# Patient Record
Sex: Female | Born: 1960 | Race: Black or African American | Hispanic: No | Marital: Married | Smoking: Never smoker
Health system: Southern US, Community
[De-identification: ages and names within clinical notes are randomized; demographics above are authoritative.]

## PROBLEM LIST (undated history)

## (undated) DIAGNOSIS — E119 Type 2 diabetes mellitus without complications: Secondary | ICD-10-CM

## (undated) DIAGNOSIS — C801 Malignant (primary) neoplasm, unspecified: Secondary | ICD-10-CM

## (undated) DIAGNOSIS — M1990 Unspecified osteoarthritis, unspecified site: Secondary | ICD-10-CM

## (undated) HISTORY — PX: COLON SURGERY: SHX602

## (undated) HISTORY — PX: KNEE SURGERY: SHX244

## (undated) HISTORY — PX: BREAST SURGERY: SHX581

---

## 2020-04-09 ENCOUNTER — Emergency Department: Payer: BC Managed Care – PPO

## 2020-04-09 ENCOUNTER — Encounter: Payer: Self-pay | Admitting: Emergency Medicine

## 2020-04-09 ENCOUNTER — Other Ambulatory Visit: Payer: Self-pay

## 2020-04-09 DIAGNOSIS — E119 Type 2 diabetes mellitus without complications: Secondary | ICD-10-CM | POA: Diagnosis not present

## 2020-04-09 DIAGNOSIS — U071 COVID-19: Secondary | ICD-10-CM | POA: Insufficient documentation

## 2020-04-09 DIAGNOSIS — Z853 Personal history of malignant neoplasm of breast: Secondary | ICD-10-CM | POA: Diagnosis not present

## 2020-04-09 DIAGNOSIS — R5383 Other fatigue: Secondary | ICD-10-CM | POA: Diagnosis present

## 2020-04-09 LAB — CBC
HCT: 39.4 % (ref 36.0–46.0)
Hemoglobin: 13.3 g/dL (ref 12.0–15.0)
MCH: 29.9 pg (ref 26.0–34.0)
MCHC: 33.8 g/dL (ref 30.0–36.0)
MCV: 88.5 fL (ref 80.0–100.0)
Platelets: 199 10*3/uL (ref 150–400)
RBC: 4.45 MIL/uL (ref 3.87–5.11)
RDW: 11.9 % (ref 11.5–15.5)
WBC: 4.1 10*3/uL (ref 4.0–10.5)
nRBC: 0 % (ref 0.0–0.2)

## 2020-04-09 LAB — BASIC METABOLIC PANEL
Anion gap: 14 (ref 5–15)
BUN: 10 mg/dL (ref 6–20)
CO2: 22 mmol/L (ref 22–32)
Calcium: 8.8 mg/dL — ABNORMAL LOW (ref 8.9–10.3)
Chloride: 99 mmol/L (ref 98–111)
Creatinine, Ser: 0.86 mg/dL (ref 0.44–1.00)
GFR, Estimated: >60 mL/min (ref >60)
Glucose, Bld: 357 mg/dL — ABNORMAL HIGH (ref 70–99)
Potassium: 4 mmol/L (ref 3.5–5.1)
Sodium: 135 mmol/L (ref 135–145)

## 2020-04-09 LAB — TROPONIN I (HIGH SENSITIVITY)
Troponin I (High Sensitivity): 7 ng/L (ref <18)
Troponin I (High Sensitivity): 5 ng/L (ref <18)

## 2020-04-09 LAB — CBG MONITORING, ED: Glucose-Capillary: 343 mg/dL — ABNORMAL HIGH (ref 70–99)

## 2020-04-09 NOTE — ED Triage Notes (Signed)
Pt to ED from home c/o generalized weakness to entire body since Christmas with burning tingling hot needle sensation to entire body, n/v/d, worsening vision and fevers.  Also reports SOB and dizziness. States difference in taste whether loss of taste or everything tastes like salt.  Pt A&Ox4, chest rise even and unlabored, skin WNL.

## 2020-04-10 ENCOUNTER — Emergency Department
Admission: EM | Admit: 2020-04-10 | Discharge: 2020-04-10 | Disposition: A | Discharge status: Home / Self Care | Payer: BC Managed Care – PPO | Attending: Emergency Medicine | Admitting: Emergency Medicine

## 2020-04-10 DIAGNOSIS — R5383 Other fatigue: Secondary | ICD-10-CM

## 2020-04-10 DIAGNOSIS — Z20822 Contact with and (suspected) exposure to covid-19: Secondary | ICD-10-CM

## 2020-04-10 HISTORY — DX: Unspecified osteoarthritis, unspecified site: M19.90

## 2020-04-10 HISTORY — DX: Type 2 diabetes mellitus without complications: E11.9

## 2020-04-10 HISTORY — DX: Malignant (primary) neoplasm, unspecified: C80.1

## 2020-04-10 LAB — SARS CORONAVIRUS 2 BY RT PCR (HOSPITAL ORDER, PERFORMED IN ~~LOC~~ HOSPITAL LAB): SARS Coronavirus 2: POSITIVE — AB

## 2020-04-10 NOTE — ED Provider Notes (Signed)
[facility] Emergency Department Provider Note  ____________________________________________   Event Date/Time   First MD Initiated Contact with Patient 04/10/20 [phone]     (approximate)  I have reviewed the triage vital signs and the nursing notes.   HISTORY  Chief Complaint Weakness and Chills (/)    HPI Brianna Skinner is a 60 y.o. female with medical history as listed below who presents for evaluation of about 3 weeks of fatigue and general malaise.  She also said that her taste has been off and that everything she eats or drinks tastes like salt or bad in someway.  She says she has just had no energy then nothing particular makes it better or worse.  She has not had any trouble breathing.  She has had some nausea and occasional vomiting and diarrhea over the last few weeks but not recently.  She is not vaccinated for COVID-19.  She denies fever/chills, chest pain, shortness of breath.   She says the symptoms are severe.        Past Medical History:  Diagnosis Date  . Arthritis   . Cancer (HCC)    Breast  . Diabetes mellitus without complication (HCC)     There are no problems to display for this patient.   Past Surgical History:  Procedure Laterality Date  . BREAST SURGERY     lumpectomy  . COLON SURGERY    . KNEE SURGERY Left     Prior to Admission medications   Not on File    Allergies Patient has no known allergies.  History reviewed. No pertinent family history.  Social History Social History   Tobacco Use  . Smoking status: Never Smoker  . Smokeless tobacco: Never Used  Substance Use Topics  . Alcohol use: Never  . Drug use: Never    Review of Systems Constitutional: No fever/chills.  General malaise and fatigue for weeks. Eyes: No visual changes. ENT: Abnormal taste and smell 4 weeks. Cardiovascular: Denies chest pain. Respiratory: Denies shortness of breath. Gastrointestinal: Intermittent nausea and diarrhea with  occasional vomiting for the last couple of weeks, better recently.  No abdominal pain. Genitourinary: Negative for dysuria. Musculoskeletal: Negative for neck pain.  Negative for back pain. Integumentary: Negative for rash. Neurological: Negative for headaches, focal weakness or numbness.   ____________________________________________   PHYSICAL EXAM:  VITAL SIGNS: ED Triage Vitals  Enc Vitals Group     BP 04/09/20 1912 (!) 159/82     Pulse Rate 04/09/20 1912 92     Resp 04/09/20 1912 18     Temp 04/09/20 1912 99.1 F (37.3 C)     Temp Source 04/09/20 1912 Oral     SpO2 04/09/20 1912 97 %     Weight 04/09/20 1913 113.4 kg (250 lb)     Height 04/09/20 1913 1.6 m (5\' 3" )     Head Circumference --      Peak Flow --      Pain Score 04/09/20 1930 10     Pain Loc --      Pain Edu? --      Excl. in GC? --     Constitutional: Alert and oriented.  Eyes: Conjunctivae are normal.  Head: Atraumatic. Nose: No congestion/rhinnorhea. Mouth/Throat: Patient is wearing a mask. Neck: No stridor.  No meningeal signs.   Cardiovascular: Normal rate, regular rhythm. Good peripheral circulation. Respiratory: Normal respiratory effort.  No retractions. Gastrointestinal: Soft and nontender. No distention.  Musculoskeletal: No lower extremity tenderness nor  edema. No gross deformities of extremities. Neurologic:  Normal speech and language. No gross focal neurologic deficits are appreciated.  Skin:  Skin is warm, dry and intact. Psychiatric: Mood and affect are normal. Speech and behavior are normal.  ____________________________________________   LABS (all labs ordered are listed, but only abnormal results are displayed)  Labs Reviewed  BASIC METABOLIC PANEL - Abnormal; Notable for the following components:      Result Value   Glucose, Bld 357 (*)    Calcium 8.8 (*)    All other components within normal limits  CBG MONITORING, ED - Abnormal; Notable for the following components:    Glucose-Capillary 343 (*)    All other components within normal limits  SARS CORONAVIRUS 2 (TAT 6-24 HRS)  CBC  TROPONIN I (HIGH SENSITIVITY)  TROPONIN I (HIGH SENSITIVITY)   ____________________________________________  EKG  ED ECG REPORT I, Hinda Kehr, the attending physician, personally viewed and interpreted this ECG.  Date: 04/09/2020 EKG Time: 19:18 Rate: 91 Rhythm: normal sinus rhythm QRS Axis: normal Intervals: normal ST/T Wave abnormalities: Non-specific ST segment / T-wave changes, but no clear evidence of acute ischemia. Narrative Interpretation: no definitive evidence of acute ischemia; does not meet STEMI criteria.   ____________________________________________  RADIOLOGY I, Hinda Kehr, personally viewed and evaluated these images (plain radiographs) as part of my medical decision making, as well as reviewing the written report by the radiologist.  ED MD interpretation: No acute abnormality  Official radiology report(s): DG Chest 2 View  Result Date: 04/09/2020 CLINICAL DATA:  Shortness of breath EXAM: CHEST - 2 VIEW COMPARISON:  None. FINDINGS: Surgical clips in the right axilla. No focal airspace disease or effusion. Borderline to mild cardiomegaly. No pneumothorax. IMPRESSION: No active cardiopulmonary disease. Electronically Signed   By: Donavan Foil M.D.   On: 04/09/2020 19:51    ____________________________________________   PROCEDURES   Procedure(s) performed (including Critical Care):  Procedures   ____________________________________________   INITIAL IMPRESSION / MDM / [place] / ED COURSE  As part of my medical decision making, I reviewed the following data within the [facility] notes reviewed and incorporated, Labs reviewed , EKG interpreted , Old chart reviewed, Radiograph reviewed  and Notes from prior ED visits   Differential diagnosis includes, but is not limited to, COVID-19, nonspecific  viral infection, electrolyte or metabolic abnormality.  Patient is well-appearing.  Vital signs stable.  Lab work is reassuring including CBC and metabolic panel.  She has hyperglycemia but a normal anion gap.  EKG shows no sign of ischemia and she has 2 - high-sensitivity troponins.  I personally reviewed the patient's imaging and agree with the radiologist's interpretation that there is no evidence of an acute abnormality.  She is having no difficulty breathing and no chest pain.  She is unvaccinated for COVID-19 and her symptoms are very consistent with COVID-19 infection.  Even if her test is positive tonight, she has been having symptoms for 3 weeks and there is no indication she has an emergent problem for which she needs to be hospitalized.  We will send the 6 to 24-hour PCR test but I explained to her that even if the result comes back negative in MyChart, is still likely the cause of her symptoms and she needs a chance to recover.  I gave my usual and customary management recommendations and return precautions and she understands and agrees with the plan.          ____________________________________________  FINAL  CLINICAL IMPRESSION(S) / ED DIAGNOSES  Final diagnoses:  Fatigue, unspecified type  Suspected COVID-19 virus infection     MEDICATIONS GIVEN DURING THIS VISIT:  Medications - No data to display   ED Discharge Orders    None      *Please note:  Verlin Swaine was evaluated in Emergency Department on 04/10/2020 for the symptoms described in the history of present illness. She was evaluated in the context of the global COVID-19 pandemic, which necessitated consideration that the patient might be at risk for infection with the SARS-CoV-2 virus that causes COVID-19. Institutional protocols and algorithms that pertain to the evaluation of patients at risk for COVID-19 are in a state of rapid change based on information released by regulatory bodies including the CDC and  federal and state organizations. These policies and algorithms were followed during the patient's care in the ED.  Some ED evaluations and interventions may be delayed as a result of limited staffing during and after the pandemic.*  Note:  This document was prepared using Dragon voice recognition software and may include unintentional dictation errors.   Hinda Kehr, MD 04/10/20 [phone]

## 2020-04-10 NOTE — Discharge Instructions (Signed)
As we discussed, we believe your symptoms are caused by a virus.  However, because we cannot rule out the possibility of COVID-19 at this time, we sent a nasal swab test from the Emergency Department (ED).  Please read through the included information in this paperwork for information about how to set up a MyChart account so that you can log in over the next couple of days for your test results (including COVID-19 swab results if one was obtained during your Emergency Department visit).  However the rest of your results were very reassuring tonight and you likely just need some additional time to recover from what ever was making you feel that over the holidays.  Read through all the included information including the recommendations from the CDC.  If your test is positive, we recommend that you self-quarantine at home for at least 7 days after your fever has gone away (without taking medication to make your temperature come down, such as Tylenol (acetaminophen)), after your respiratory symptoms have improved. You should have as minimal contact as possible with anyone else including close family as per the [facility] paperwork guidelines listed below. Follow-up with your doctor by phone or online as needed and return immediately to the emergency department or call 911 only if you develop new or worsening symptoms that concern you.  You can find up-to-date information about COVID-19 in [place] by calling the [employer] Helpline: [phone]. You may also call 2-1-1, or [phone], or additional resources.  You can also find information online at PureLoser[employer]gl, or on the Center for Disease Control (CDC) website at http://bradshaw.com/.

## 2021-07-05 IMAGING — CR DG CHEST 2V
2 series · 2 of 2 positions shown · non-contrast
Comparison: None.

CLINICAL DATA: Shortness of breath

EXAM:
CHEST - 2 VIEW

[chest pa]
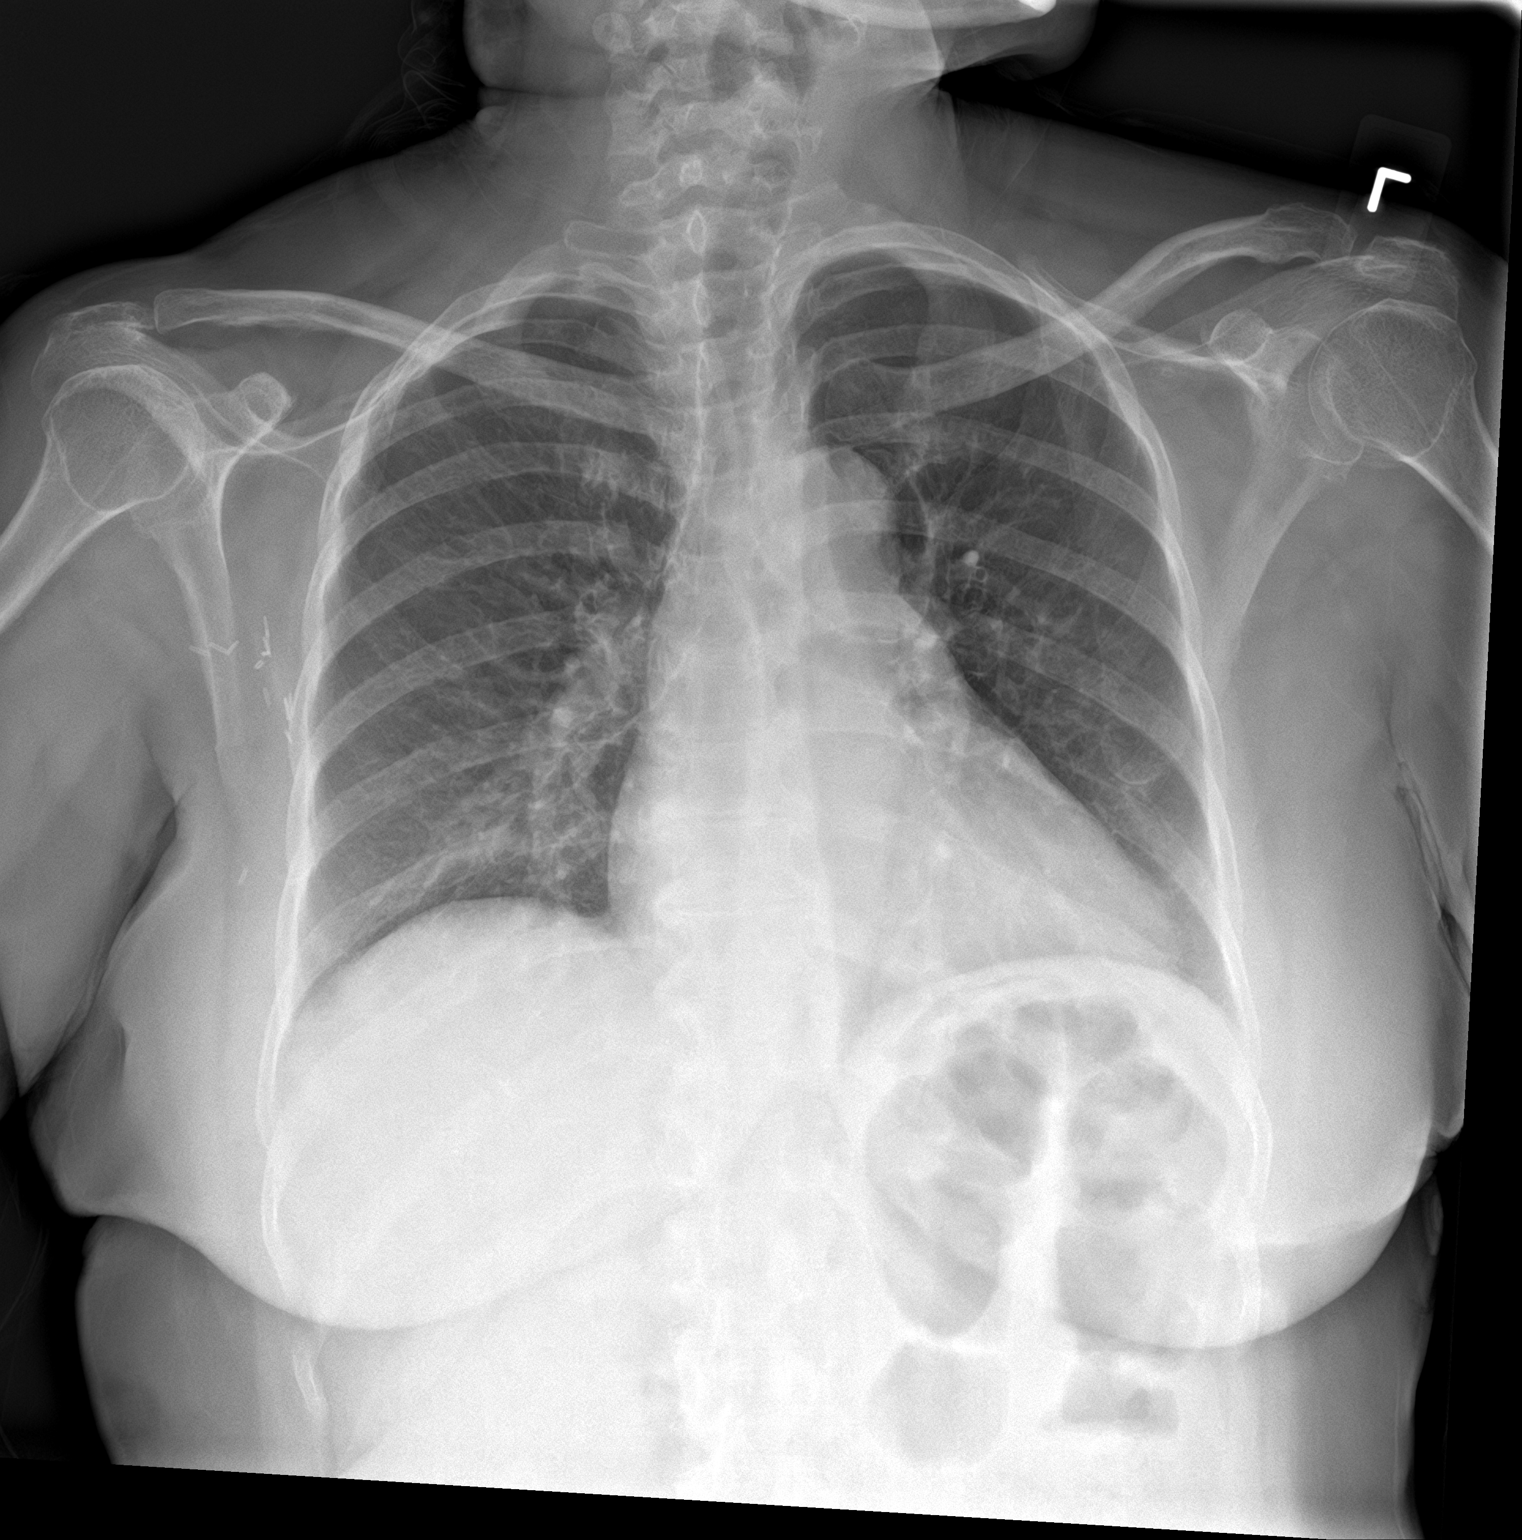

[chest lat]
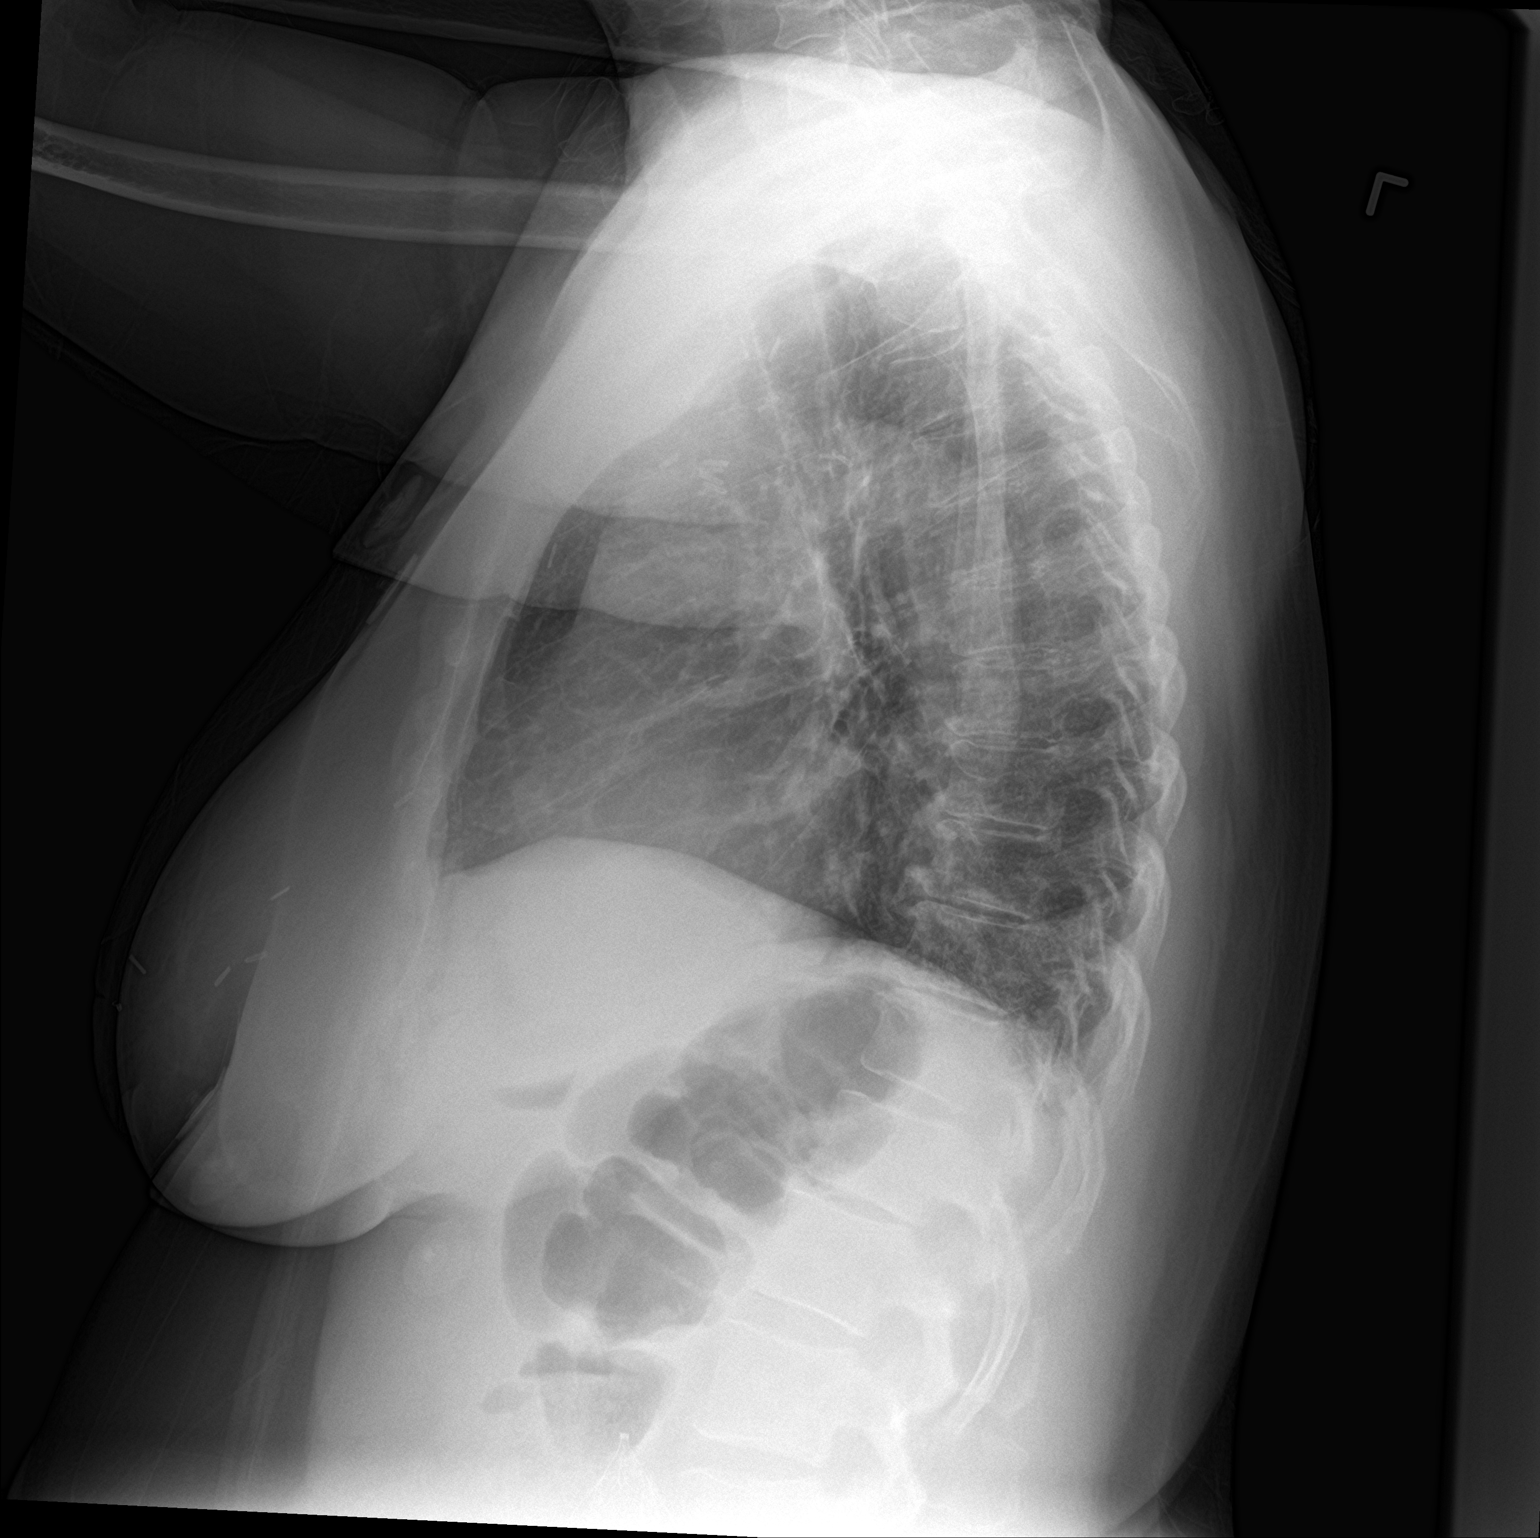

[2 of 2 positions shown; findings below may reference images not displayed]

FINDINGS: Surgical clips in the right axilla. No focal airspace disease or
effusion. Borderline to mild cardiomegaly. No pneumothorax.
IMPRESSION: No active cardiopulmonary disease.
